# Patient Record
Sex: Male | Born: 1985 | Race: White | Hispanic: No | Marital: Married | Smoking: Current every day smoker
Health system: Southern US, Community
[De-identification: ages and names within clinical notes are randomized; demographics above are authoritative.]

## PROBLEM LIST (undated history)

## (undated) DIAGNOSIS — F32A Depression, unspecified: Secondary | ICD-10-CM

## (undated) DIAGNOSIS — L509 Urticaria, unspecified: Secondary | ICD-10-CM

## (undated) DIAGNOSIS — I10 Essential (primary) hypertension: Secondary | ICD-10-CM

## (undated) DIAGNOSIS — F4310 Post-traumatic stress disorder, unspecified: Secondary | ICD-10-CM

## (undated) DIAGNOSIS — T783XXA Angioneurotic edema, initial encounter: Secondary | ICD-10-CM

## (undated) DIAGNOSIS — F419 Anxiety disorder, unspecified: Secondary | ICD-10-CM

## (undated) DIAGNOSIS — F329 Major depressive disorder, single episode, unspecified: Secondary | ICD-10-CM

## (undated) HISTORY — DX: Angioneurotic edema, initial encounter: T78.3XXA

## (undated) HISTORY — DX: Urticaria, unspecified: L50.9

## (undated) HISTORY — PX: APPENDECTOMY: SHX54

---

## 2012-06-08 ENCOUNTER — Encounter (HOSPITAL_COMMUNITY): Payer: Self-pay

## 2012-06-08 ENCOUNTER — Emergency Department (HOSPITAL_COMMUNITY)
Admission: EM | Admit: 2012-06-08 | Discharge: 2012-06-08 | Disposition: A | Discharge status: Home / Self Care | Payer: Commercial Managed Care - PPO | Attending: Emergency Medicine | Admitting: Emergency Medicine

## 2012-06-08 DIAGNOSIS — F329 Major depressive disorder, single episode, unspecified: Secondary | ICD-10-CM | POA: Insufficient documentation

## 2012-06-08 DIAGNOSIS — F411 Generalized anxiety disorder: Secondary | ICD-10-CM | POA: Insufficient documentation

## 2012-06-08 DIAGNOSIS — Z8659 Personal history of other mental and behavioral disorders: Secondary | ICD-10-CM | POA: Insufficient documentation

## 2012-06-08 DIAGNOSIS — G479 Sleep disorder, unspecified: Secondary | ICD-10-CM | POA: Insufficient documentation

## 2012-06-08 DIAGNOSIS — F3289 Other specified depressive episodes: Secondary | ICD-10-CM | POA: Insufficient documentation

## 2012-06-08 DIAGNOSIS — F419 Anxiety disorder, unspecified: Secondary | ICD-10-CM

## 2012-06-08 DIAGNOSIS — R630 Anorexia: Secondary | ICD-10-CM | POA: Insufficient documentation

## 2012-06-08 DIAGNOSIS — F17200 Nicotine dependence, unspecified, uncomplicated: Secondary | ICD-10-CM | POA: Insufficient documentation

## 2012-06-08 NOTE — ED Notes (Signed)
He c/o clouded thinking; plus some G.I. Upset, including occasional n/v and diarrhea. He is more concerned with his inability to concentrate and finds himself at times staring at objects upon which he is supposed to be working for indeterminate amounts of time.  He states he is a 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 His wife confirms all these symptoms.

## 2012-06-08 NOTE — ED Notes (Signed)
At time of discharge patient was alert and oriented x 3 . Wife at patient sides not expressing in concerns.

## 2012-06-08 NOTE — ED Provider Notes (Signed)
History     CSN: [id number]  Arrival date & time 06/08/12  1653   First MD Initiated Contact with Patient 06/08/12 1855      Chief Complaint  Patient presents with  . Altered Mental Status    (Consider location/radiation/quality/duration/timing/severity/associated sxs/[place] treatment) HPI Comments: Patient with a history of PTSD who has served in the [employer] and was in [place] one year ago presents today with a chief complaint of anxiety, difficulty sleeping, difficulty focusing, and decreased energy.  He reports that while at work he feels that there are moments were he "spaces out" temporarily.  Symptoms have been present for the past 2 weeks.  Wife reports that she has not noticed any of these episodes at home, but has noticed that the patient has been more anxious.  Wife reports that she has not noticed any confusion.  Patient does admit to being more anxious and depressed recently, but denies SI or HI.  He reports that he has seen a Counselor in the past, but is currently not seeing anyone.  He states that he has been on medication for anxiety in the past, but is not on any medication currently.  He did not like the way the medication made him feel.  He denies fever, chills, headache, dizziness, syncope, nausea, or vomiting.    The history is provided by the patient and the spouse.    No past medical history on file.  Past Surgical History  Procedure Laterality Date  . Appendectomy      History reviewed. No pertinent family history.  History  Substance Use Topics  . Smoking status: Current Every Day Smoker -- 0.50 packs/day    Types: Cigarettes  . Smokeless tobacco: Not on file  . Alcohol Use: Not on file      Review of Systems  Constitutional: Positive for appetite change.  Neurological: Negative for dizziness, syncope, light-headedness and headaches.  Psychiatric/Behavioral: Positive for sleep disturbance, dysphoric mood and decreased concentration. Negative for  suicidal ideas, hallucinations and self-injury. The patient is nervous/anxious.   All other systems reviewed and are negative.    Allergies  Other  Home Medications  No current outpatient prescriptions on file.  BP 163/94  Pulse 113  Temp(Src) 98.8 F (37.1 C) (Oral)  Resp 22  SpO2 100%  Physical Exam  Nursing note and vitals reviewed. Constitutional: He is oriented to person, place, and time. He appears well-developed and well-nourished. No distress.  HENT:  Head: Normocephalic and atraumatic.  Mouth/Throat: Oropharynx is clear and moist.  Eyes: EOM are normal. Pupils are equal, round, and reactive to light.  Neck: Normal range of motion. Neck supple.  Cardiovascular: Normal rate, regular rhythm and normal heart sounds.   Pulmonary/Chest: Effort normal and breath sounds normal. No respiratory distress. He has no wheezes. He has no rales.  Musculoskeletal: Normal range of motion.  Neurological: He is alert and oriented to person, place, and time. He has normal strength. No cranial nerve deficit or sensory deficit. Coordination and gait normal.  Skin: Skin is warm and dry. He is not diaphoretic.  Psychiatric: His mood appears anxious. He expresses no homicidal and no suicidal ideation. He expresses no suicidal plans and no homicidal plans.    ED Course  Procedures (including critical care time)  Labs Reviewed - No data to display No results found.   No diagnosis found.  Patient discussed with Dr. Oletta Lamas.  MDM  Patient with a history of PTSD, depression, and anxiety presents today with difficulty  focusing, difficulty sleeping, and increased anxiety.  He denies SI or HI.  He is alert and orientated with a normal neurological exam in the ED.  Do not feel that further work up is indicated.  Patient given resource guide and instructed to follow up with a Therapist and Psychiatrist.          Pascal Lux [place], PA-C 06/10/12 1253

## 2012-06-10 NOTE — ED Provider Notes (Signed)
Medical screening examination/treatment/procedure(s) were performed by non-physician practitioner and as supervising physician I was immediately available for consultation/collaboration.   Gavin Pound. Oletta Lamas, MD 06/10/12 1610

## 2012-08-06 ENCOUNTER — Emergency Department (HOSPITAL_BASED_OUTPATIENT_CLINIC_OR_DEPARTMENT_OTHER)
Admission: EM | Admit: 2012-08-06 | Discharge: 2012-08-06 | Disposition: A | Discharge status: Home / Self Care | Payer: Self-pay | Attending: Emergency Medicine | Admitting: Emergency Medicine

## 2012-08-06 ENCOUNTER — Emergency Department (HOSPITAL_BASED_OUTPATIENT_CLINIC_OR_DEPARTMENT_OTHER): Payer: Self-pay

## 2012-08-06 ENCOUNTER — Encounter (HOSPITAL_BASED_OUTPATIENT_CLINIC_OR_DEPARTMENT_OTHER): Payer: Self-pay | Admitting: *Deleted

## 2012-08-06 DIAGNOSIS — Z8659 Personal history of other mental and behavioral disorders: Secondary | ICD-10-CM | POA: Insufficient documentation

## 2012-08-06 DIAGNOSIS — F329 Major depressive disorder, single episode, unspecified: Secondary | ICD-10-CM | POA: Insufficient documentation

## 2012-08-06 DIAGNOSIS — F3289 Other specified depressive episodes: Secondary | ICD-10-CM | POA: Insufficient documentation

## 2012-08-06 DIAGNOSIS — F411 Generalized anxiety disorder: Secondary | ICD-10-CM | POA: Insufficient documentation

## 2012-08-06 DIAGNOSIS — I10 Essential (primary) hypertension: Secondary | ICD-10-CM | POA: Insufficient documentation

## 2012-08-06 DIAGNOSIS — R55 Syncope and collapse: Secondary | ICD-10-CM | POA: Insufficient documentation

## 2012-08-06 DIAGNOSIS — F17200 Nicotine dependence, unspecified, uncomplicated: Secondary | ICD-10-CM | POA: Insufficient documentation

## 2012-08-06 HISTORY — DX: Post-traumatic stress disorder, unspecified: F43.10

## 2012-08-06 HISTORY — DX: Anxiety disorder, unspecified: F41.9

## 2012-08-06 HISTORY — DX: Depression, unspecified: F32.A

## 2012-08-06 HISTORY — DX: Essential (primary) hypertension: I10

## 2012-08-06 HISTORY — DX: Major depressive disorder, single episode, unspecified: F32.9

## 2012-08-06 LAB — URINALYSIS, ROUTINE W REFLEX MICROSCOPIC
Specific Gravity, Urine: 1.026 (ref 1.005–1.030)
pH: 6 (ref 5.0–8.0)

## 2012-08-06 LAB — CBC WITH DIFFERENTIAL/PLATELET
Basophils Relative: 0 % (ref 0–1)
Eosinophils Relative: 0 % (ref 0–5)
HCT: 44.7 % (ref 39.0–52.0)
Hemoglobin: 16.6 g/dL (ref 13.0–17.0)
Lymphocytes Relative: 5 % — ABNORMAL LOW (ref 12–46)
MCHC: 37.1 g/dL — ABNORMAL HIGH (ref 30.0–36.0)
Monocytes Relative: 10 % (ref 3–12)
Neutro Abs: 9.7 10*3/uL — ABNORMAL HIGH (ref 1.7–7.7)
Neutrophils Relative %: 85 % — ABNORMAL HIGH (ref 43–77)
RBC: 5.03 MIL/uL (ref 4.22–5.81)

## 2012-08-06 LAB — BASIC METABOLIC PANEL
BUN: 10 mg/dL (ref 6–23)
CO2: 24 mEq/L (ref 19–32)
Chloride: 98 mEq/L (ref 96–112)
Glucose, Bld: 107 mg/dL — ABNORMAL HIGH (ref 70–99)
Potassium: 3.7 mEq/L (ref 3.5–5.1)
Sodium: 136 mEq/L (ref 135–145)

## 2012-08-06 LAB — URINE MICROSCOPIC-ADD ON

## 2012-08-06 LAB — GLUCOSE, CAPILLARY: Glucose-Capillary: 114 mg/dL — ABNORMAL HIGH (ref 70–99)

## 2012-08-06 MED ORDER — SODIUM CHLORIDE 0.9 % IV BOLUS (SEPSIS)
1000.0000 mL | Freq: Once | INTRAVENOUS | Status: AC
  Administered 2012-08-06: 1000 mL via INTRAVENOUS

## 2012-08-06 NOTE — ED Notes (Signed)
Patient transported to X-ray 

## 2012-08-06 NOTE — ED Notes (Signed)
Patient ambulatory to restroom without difficulty or assistance.

## 2012-08-06 NOTE — ED Provider Notes (Signed)
Medical screening examination/treatment/procedure(s) were performed by non-physician practitioner and as supervising physician I was immediately available for consultation/collaboration.   Gwyneth Sprout, MD 08/06/12 [phone]

## 2012-08-06 NOTE — ED Notes (Signed)
Pt c/o feeling hot and tired and work, passing out. Pt sts he now feels "more normal but tired". Pt sts he has not been sleeping and has had 2 days of n/v/d. Pt recently started new psych med.

## 2012-08-06 NOTE — ED Provider Notes (Signed)
History    CSN: [id number] Arrival date & time 08/06/12  1302  First MD Initiated Contact with Patient 08/06/12 1330     Chief Complaint  Patient presents with  . Loss of Consciousness   (Consider location/radiation/quality/duration/timing/severity/associated sxs/[place] Treatment) HPI Comments: 27 y.o. 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 Pt reports that he has been fighting a "stomach bug" for the last two days including nausea, vomiting, diarrhea, and mild generalized stomach discomfort. Pt denies fever, dysuria, hematuria. Pt states that although he has been vomiting, he has been able to eat and drink and keep it down for a few hours before vomiting. Denies nausea/vomiting today. Pt was feeling better today so decided to go to work. States it was hot and he was feeling tired and weak from being sick and felt he was "going down," so started to lay himself on the floor.  Pt states he was groggy coming to, but felt better when his co-workers brought him into the air conditioned break room. Co-workers called EMS. Admits he has been having leg cramps and arm cramps for several days. Denies visual disturbances, headaches.   Patient is a 27 y.o. male presenting with syncope.  Loss of Consciousness Associated symptoms: no chest pain, no diaphoresis, no dizziness, no fever, no headaches, no nausea, no palpitations, no shortness of breath, no vomiting and no weakness    Past Medical History  Diagnosis Date  . PTSD (post-traumatic stress disorder)   . Hypertension   . Depression   . Anxiety    Past Surgical History  Procedure Laterality Date  . Appendectomy     History reviewed. No pertinent family history. History  Substance Use Topics  . Smoking status: Current Every Day Smoker -- 0.50 packs/day    Types: Cigarettes  . Smokeless tobacco: Not on file  . Alcohol Use: No    Review of Systems  Constitutional: Negative for  fever and diaphoresis.  HENT: Negative for neck pain and neck stiffness.   Eyes: Negative for visual disturbance.  Respiratory: Negative for apnea, chest tightness and shortness of breath.   Cardiovascular: Positive for syncope. Negative for chest pain and palpitations.  Gastrointestinal: Negative for nausea, vomiting, diarrhea and constipation.  Genitourinary: Negative for dysuria.  Musculoskeletal: Negative for gait problem.  Skin: Negative for rash.  Neurological: Positive for syncope. Negative for dizziness, weakness, light-headedness, numbness and headaches.    Allergies  Other  Home Medications   Current Outpatient Rx  Name  Route  Sig  Dispense  Refill  . TRAZODONE HCL PO   Oral   Take by mouth.         . Vortioxetine HBr (BRINTELLIX PO)   Oral   Take by mouth.          BP 159/95  Pulse 104  Temp(Src) 98.5 F (36.9 C) (Oral)  Resp 18  SpO2 100% Physical Exam  Nursing note and vitals reviewed. Constitutional: He is oriented to person, place, and time. He appears well-developed and well-nourished. No distress.  HENT:  Head: Normocephalic and atraumatic.  Eyes: Conjunctivae and EOM are normal.  Neck: Normal range of motion. Neck supple.  No meningeal signs  Cardiovascular: Normal rate, regular rhythm and normal heart sounds.  Exam reveals no gallop and no friction rub.   No murmur heard. Pulmonary/Chest: Effort normal and breath sounds normal. No respiratory distress. He has no wheezes. He has no rales. He exhibits  no tenderness.  Abdominal: Soft. Bowel sounds are normal. He exhibits no distension. There is no tenderness. There is no rebound and no guarding.  Musculoskeletal: Normal range of motion. He exhibits no edema and no tenderness.  FROM to upper and lower extremities  Neurological: He is alert and oriented to person, place, and time. No cranial nerve deficit.  Speech is clear and goal oriented, follows commands Sensation normal to light touch and two  point discrimination Moves extremities without ataxia, coordination intact Normal gait and balance Normal strength in upper and lower extremities bilaterally including dorsiflexion and plantar flexion, strong and equal grip strength   Skin: Skin is warm and dry. He is not diaphoretic. No erythema.  Psychiatric: He has a normal mood and affect.    ED Course  Procedures (including critical care time) Labs Reviewed  GLUCOSE, CAPILLARY - Abnormal; Notable for the following:    Glucose-Capillary 114 (*)    All other components within normal limits  CBC WITH DIFFERENTIAL - Abnormal; Notable for the following:    WBC 11.4 (*)    MCHC 37.1 (*)    Platelets 123 (*)    Neutrophils Relative % 85 (*)    Lymphocytes Relative 5 (*)    Neutro Abs 9.7 (*)    Lymphs Abs 0.6 (*)    Monocytes Absolute 1.1 (*)    All other components within normal limits  BASIC METABOLIC PANEL - Abnormal; Notable for the following:    Glucose, Bld 107 (*)    GFR calc non Af Amer 81 (*)    All other components within normal limits  URINALYSIS, ROUTINE W REFLEX MICROSCOPIC - Abnormal; Notable for the following:    Color, Urine AMBER (*)    APPearance CLOUDY (*)    Hgb urine dipstick TRACE (*)    Bilirubin Urine SMALL (*)    Ketones, ur 15 (*)    Protein, ur 100 (*)    All other components within normal limits  URINE MICROSCOPIC-ADD ON - Abnormal; Notable for the following:    Squamous Epithelial / LPF FEW (*)    Casts HYALINE CASTS (*)    All other components within normal limits   Dg Chest 2 View  08/06/2012   *RADIOLOGY REPORT*  Clinical Data: Syncope  CHEST - 2 VIEW  Comparison: None.  Findings: Cardiomediastinal silhouette is unremarkable.  No acute infiltrate or pleural effusion.  No pulmonary edema.  Bony thorax is unremarkable.  IMPRESSION: No active disease.   Original Report Authenticated By: Natasha Mead, M.D.   1. Syncope and collapse     MDM  Pt afebrile. EKG shows sinus rhythm. Vasovagal is high  on the differential, but want to rule out cardiac, infection, electrolyte, hypoglycemia. Will order basic labs, IVF, CXR, and re-evaluate.  EKG and chest xray are normal. Lab work unconcerning. Pt states feeling better after 2L of fluid. Successful PO challenge. Ambulates well around ED.  Pt tolerated orthostatics well and ambulated well. At this time there does not appear to be any evidence of an acute emergency medical condition and the patient appears stable for discharge with appropriate outpatient follow up.Diagnosis was discussed with patient who verbalizes understanding and is agreeable to discharge. Pt case discussed with Dr. Anitra Lauth who agrees with plan.      Glade Nurse, PA-C 08/06/12 1659

## 2012-08-28 NOTE — ED Notes (Signed)
Pt wife presented to ED lobby requesting a work note for pt from visit on 07/15. Pt family member told that pt must present to ED with photo ID in order for a reprint to be possible.

## 2012-08-29 NOTE — ED Notes (Signed)
Pt presented to ED lobby with photo ID requesting copy of work note given on 07/15 visit. Note provided.

## 2014-07-30 IMAGING — CR DG CHEST 2V
2 series · 2 of 2 positions shown · non-contrast
Comparison: None.

CLINICAL DATA: Syncope

CHEST - 2 VIEW

[w chest pa]
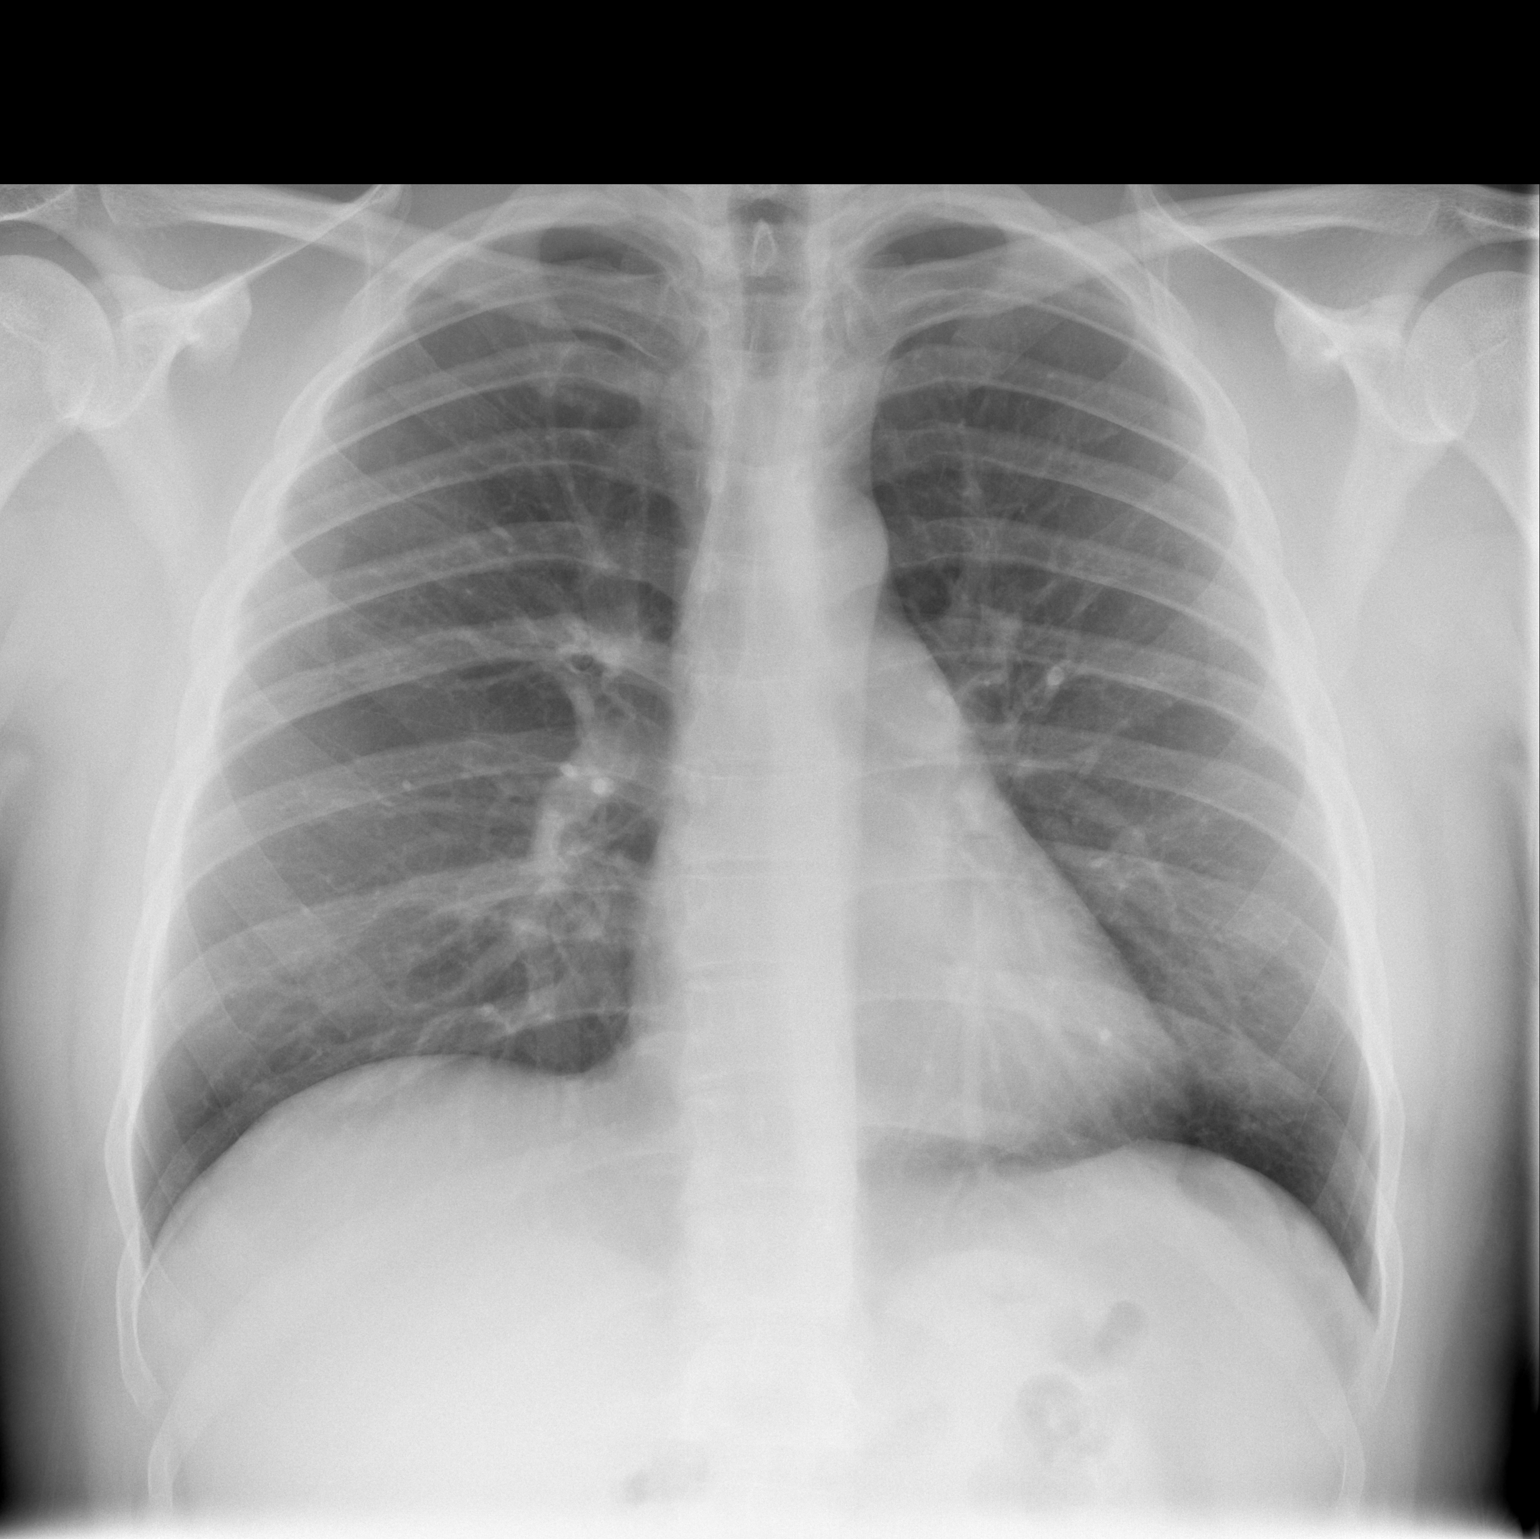

[w chest lat]
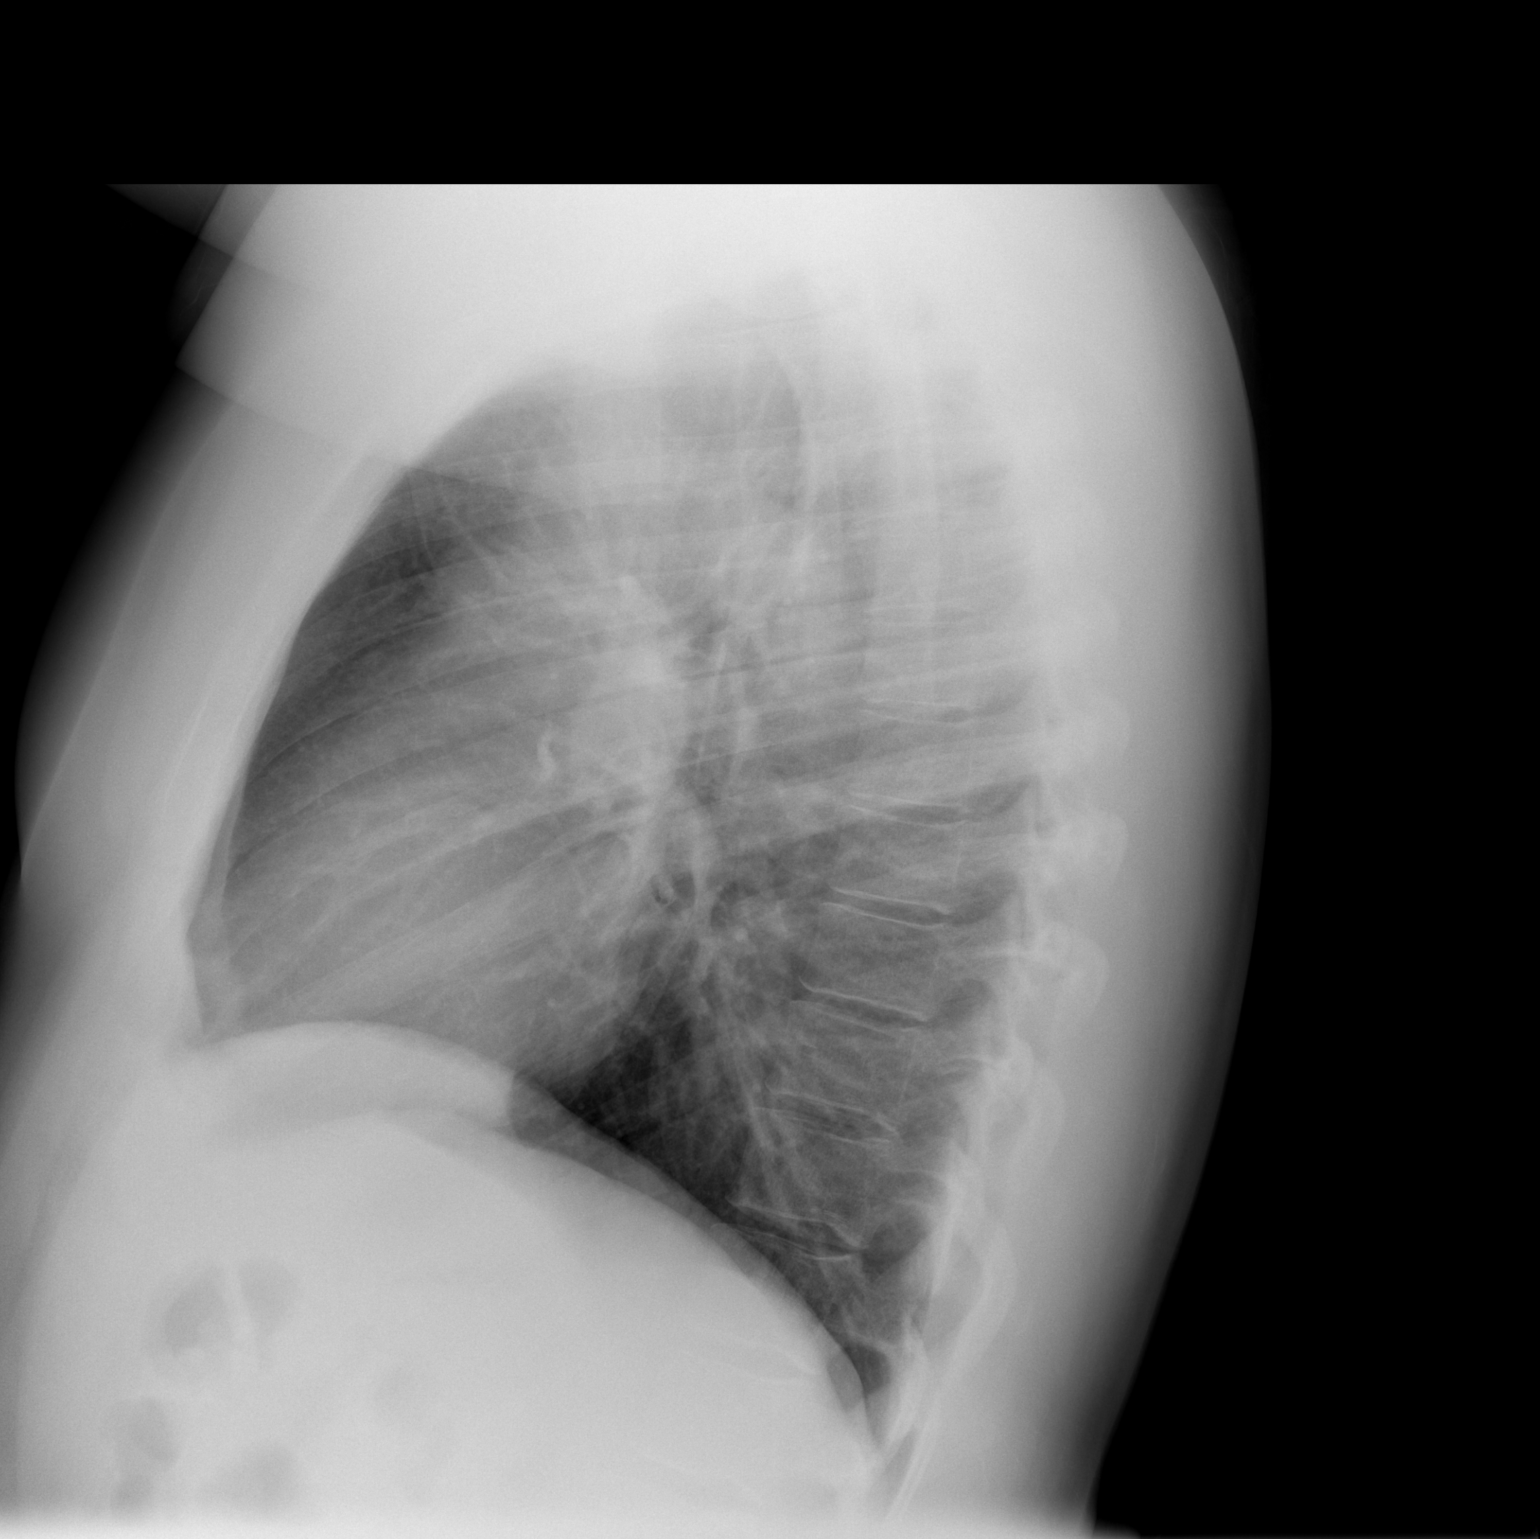

[2 of 2 positions shown; findings below may reference images not displayed]

FINDINGS: Cardiomediastinal silhouette is unremarkable.  No acute
infiltrate or pleural effusion.  No pulmonary edema.  Bony thorax
is unremarkable.
IMPRESSION: No active disease.

## 2016-02-09 ENCOUNTER — Ambulatory Visit: Payer: Self-pay

## 2016-02-16 ENCOUNTER — Telehealth: Payer: Self-pay

## 2016-02-16 NOTE — Telephone Encounter (Signed)
lvm to call back to reschedule appt from 02/09/16

## 2019-12-25 ENCOUNTER — Encounter: Payer: Self-pay | Admitting: Allergy

## 2019-12-25 ENCOUNTER — Ambulatory Visit (INDEPENDENT_AMBULATORY_CARE_PROVIDER_SITE_OTHER): Payer: No Typology Code available for payment source | Admitting: Allergy

## 2019-12-25 ENCOUNTER — Other Ambulatory Visit: Payer: Self-pay

## 2019-12-25 VITALS — BP 120/78 | HR 74 | Temp 98.7°F | Resp 18 | Ht 72.5 in | Wt 190.2 lb

## 2019-12-25 DIAGNOSIS — L508 Other urticaria: Secondary | ICD-10-CM | POA: Diagnosis not present

## 2019-12-25 DIAGNOSIS — T783XXD Angioneurotic edema, subsequent encounter: Secondary | ICD-10-CM

## 2019-12-25 MED ORDER — FAMOTIDINE 20 MG PO TABS: 20.0000 mg | ORAL_TABLET | Freq: Two times a day (BID) | ORAL | 5 refills | Status: DC

## 2019-12-25 MED ORDER — CETIRIZINE HCL 10 MG PO TABS: 10.0000 mg | ORAL_TABLET | Freq: Two times a day (BID) | ORAL | 5 refills | Status: DC

## 2019-12-25 NOTE — Progress Notes (Signed)
New Patient Note  RE: Leon Reyes MRN: [id number] DOB: Jun 27, 1985 Date of Office Visit: 12/25/2019  Referring provider: Lytle Michaels, DO Primary care provider: Vick Frees, DO  Chief Complaint: rash and itching  History of present illness: Leon Reyes is a 34 y.o. male presenting today for consultation for pruritus, rash.   He reports he keeps breaking out in a rash every few weeks.  He states he has no explanations/triggers for it.  His diet is very consistent except for holidays.  He states his schedule is also very consistent day to day. The rash started in Oct 2021 and denies having hives [place] to this.  He was seen in the [facility] [place] for hives and got a steroid injection which he states he had worsening rash the next day.  He went back to this [place] and he [facility] where he received more steroid injections and oral steroid taper.   He has been taking loratadine in PM and cetirizine in AM and states by end of the day the hives are gone.  He has had swelling of his hands, feet and around his hairline.  The rash is very itchy.  He denies any marks or bruising left behind once resolved.  No fevers, joint aches or pains with the hives.  He denies any exacerbating factors.  He does exercise on a routine basis and has not noted any issues with his hives worsening.  He also has not noted any pressure induced hives.  He also has not noted any temperature induced hives. He provided pictures today that are consistent with urticarial wheals or hives.  Review of systems: Review of Systems  Constitutional: Negative.   HENT: Negative.   Eyes: Negative.   Respiratory: Negative.   Cardiovascular: Negative.   Gastrointestinal: Negative.   Musculoskeletal: Negative.   Skin: Positive for itching and rash.  Neurological: Negative.     All other systems negative unless noted above in HPI  Past medical history: Past  Medical History:  Diagnosis Date  . Angio-edema   . Anxiety   . Depression   . Hypertension   . PTSD (post-traumatic stress disorder)   . Urticaria     Past surgical history: Past Surgical History:  Procedure Laterality Date  . APPENDECTOMY      Family history:  Family History  Problem Relation Age of Onset  . Psoriasis Mother     Social history: He lives in a home without carpeting with electric heating and central cooling.  Dogs in the home.  There is no concern for water damage, mildew or roaches in the home.  He is the Customer service manager.  He does smoke cigarettes half pack per day.  Medication List: Current Outpatient Medications  Medication Sig Dispense Refill  . TRAZODONE HCL PO Take by mouth.     No current facility-administered medications for this visit.    Known medication allergies: Allergies  Allergen Reactions  . Other Nausea And Vomiting    "Mycins"     Physical examination: Blood pressure 120/78, pulse 74, temperature 98.7 F (37.1 C), temperature source Temporal, resp. rate 18, height 6' 0.5" (1.842 m), weight 190 lb 3.2 oz (86.3 kg), SpO2 97 %.  General: Alert, interactive, in no acute distress. HEENT: PERRLA, TMs pearly gray, turbinates non-edematous without discharge, post-pharynx non erythematous. Neck: Supple without lymphadenopathy. Lungs: Clear to auscultation without wheezing, rhonchi or rales. {no increased work of breathing. CV:  Normal S1, S2 without murmurs. Abdomen: Nondistended, nontender. Skin: Warm and dry, without lesions or rashes. Extremities:  No clubbing, cyanosis or edema. Neuro:   Grossly intact.  Diagnositics/Labs: None today.  Recent urticaria outbreak  Assessment and plan: Urticaria with angioedema, chronic  - at this time etiology of hives and swelling is unknown.  Hives can be caused by a variety of different triggers including illness/infection, foods, medications, stings, exercise, pressure, vibrations,  extremes of temperature to name a few however majority of the time there is no identifiable trigger.  Your symptoms have been ongoing for >6 weeks making this chronic thus will obtain labwork to evaluate: CBC w diff, CMP, tryptase, hive panel, environmental panel, alpha-gal panel  - would continue antihistamine regimen and recommend the following: Cetirizine 1 tab twice a day with Famotidine 1 tab twice a day.   Famotidine is a Histamine-2 blocker while Cetirizine is a Histamine-1 blocker  - if you are hive/swelling free for at least 3-4 weeks then can try to wean off medications as follows:    Cetirizine (Zyrtec) 10mg  twice a day and famotidine 20 mg (Pepcid) once a day.  If no symptoms for 7-14 days then decrease to.  Cetirizine (Zyrtec) 10mg  twice a day.  If no symptoms for 7-14 days then decrease to.  Cetirizine (Zyrtec) 10mg  once a day.  May use Benadryl (diphenhydramine) as needed for breakthrough hives        If symptoms return, then step up dosage  - if the above antihistamine regimen does not help improve hive control then will add in Singulair and if still not enough then consider Xolair monthly injections.  This will be discussed if further detail in needing to initiate use  Follow-up in 2-3 months or sooner if needed   I appreciate the opportunity to take part in Hillery's care. Please do not hesitate to contact me with questions.  Sincerely,   , MD Allergy/Immunology [facility]

## 2019-12-25 NOTE — Patient Instructions (Signed)
Hives with swelling, chronic  - at this time etiology of hives and swelling is unknown.  Hives can be caused by a variety of different triggers including illness/infection, foods, medications, stings, exercise, pressure, vibrations, extremes of temperature to name a few however majority of the time there is no identifiable trigger.  Your symptoms have been ongoing for >6 weeks making this chronic thus will obtain labwork to evaluate: CBC w diff, CMP, tryptase, hive panel, environmental panel, alpha-gal panel  - would continue antihistamine regimen and recommend the following: Cetirizine 1 tab twice a day with Famotidine 1 tab twice a day.   Famotidine is a Histamine-2 blocker while Cetirizine is a Histamine-1 blocker  - if you are hive/swelling free for at least 3-4 weeks then can try to wean off medications as follows:    Cetirizine (Zyrtec) 10mg  twice a day and famotidine 20 mg (Pepcid) once a day.  If no symptoms for 7-14 days then decrease to  Cetirizine (Zyrtec) 10mg  twice a day.  If no symptoms for 7-14 days then decrease to  Cetirizine (Zyrtec) 10mg  once a day.  May use Benadryl (diphenhydramine) as needed for breakthrough hives        If symptoms return, then step up dosage  - if the above antihistamine regimen does not help improve hive control then will add in Singulair and if still not enough then consider Xolair monthly injections.  This will be discussed if further detail in needing to initiate use  Follow-up in 2-3 months or sooner if needed  **We are ordering labs, so please allow 1-2 weeks for the results to come back.  With the newly implemented Cures Act, the labs might be visible to you at the same time that they become visible to me.  However, I will not address the results until all of the results come  back, so please be patient.  In the meantime, continue avoiding your triggering food(s) in your After Visit Summary, including avoidance measures (if applicable), until you  hear from me about the results.

## 2019-12-27 LAB — ALPHA-GAL PANEL

## 2019-12-30 LAB — ALPHA-GAL PANEL

## 2020-01-09 LAB — COMPREHENSIVE METABOLIC PANEL
ALT: 27 IU/L (ref 0–44)
AST: 43 IU/L — ABNORMAL HIGH (ref 0–40)
Albumin/Globulin Ratio: 3.1 — ABNORMAL HIGH (ref 1.2–2.2)
Albumin: 5 g/dL (ref 4.0–5.0)
Alkaline Phosphatase: 42 IU/L — ABNORMAL LOW (ref 44–121)
BUN/Creatinine Ratio: 18 (ref 9–20)
BUN: 21 mg/dL — ABNORMAL HIGH (ref 6–20)
Bilirubin Total: 0.3 mg/dL (ref 0.0–1.2)
CO2: 23 mmol/L (ref 20–29)
Calcium: 9.7 mg/dL (ref 8.7–10.2)
Chloride: 100 mmol/L (ref 96–106)
Creatinine, Ser: 1.17 mg/dL (ref 0.76–1.27)
GFR calc Af Amer: 93 mL/min/{1.73_m2} (ref >59)
GFR calc non Af Amer: 80 mL/min/{1.73_m2} (ref >59)
Globulin, Total: 1.6 g/dL (ref 1.5–4.5)
Glucose: 84 mg/dL (ref 65–99)
Potassium: 4.2 mmol/L (ref 3.5–5.2)
Sodium: 137 mmol/L (ref 134–144)
Total Protein: 6.6 g/dL (ref 6.0–8.5)

## 2020-01-09 LAB — CBC WITH DIFFERENTIAL
Basophils Absolute: 0 10*3/uL (ref 0.0–0.2)
Basos: 1 %
EOS (ABSOLUTE): 0.1 10*3/uL (ref 0.0–0.4)
Eos: 2 %
Hematocrit: 44.9 % (ref 37.5–51.0)
Hemoglobin: 15.6 g/dL (ref 13.0–17.7)
Immature Grans (Abs): 0 10*3/uL (ref 0.0–0.1)
Immature Granulocytes: 0 %
Lymphocytes Absolute: 1.9 10*3/uL (ref 0.7–3.1)
Lymphs: 25 %
MCH: 31.8 pg (ref 26.6–33.0)
MCHC: 34.7 g/dL (ref 31.5–35.7)
MCV: 91 fL (ref 79–97)
Monocytes Absolute: 0.7 10*3/uL (ref 0.1–0.9)
Monocytes: 9 %
Neutrophils Absolute: 4.8 10*3/uL (ref 1.4–7.0)
Neutrophils: 63 %
RBC: 4.91 x10E6/uL (ref 4.14–5.80)
RDW: 11.8 % (ref 11.6–15.4)
WBC: 7.5 10*3/uL (ref 3.4–10.8)

## 2020-01-09 LAB — ALLERGENS W/TOTAL IGE AREA 2
Alternaria Alternata IgE: <0.1 kU/L
Aspergillus Fumigatus IgE: <0.1 kU/L
Bermuda Grass IgE: <0.1 kU/L
Cat Dander IgE: <0.1 kU/L
Cedar, Mountain IgE: <0.1 kU/L
Cladosporium Herbarum IgE: <0.1 kU/L
Cockroach, German IgE: <0.1 kU/L
Common Silver Birch IgE: <0.1 kU/L
Cottonwood IgE: <0.1 kU/L
D Farinae IgE: 0.29 kU/L — AB
D Pteronyssinus IgE: 0.21 kU/L — AB
Dog Dander IgE: <0.1 kU/L
Elm, American IgE: <0.1 kU/L
IgE (Immunoglobulin E), Serum: 18 IU/mL (ref 6–495)
Johnson Grass IgE: <0.1 kU/L
Maple/Box Elder IgE: <0.1 kU/L
Mouse Urine IgE: <0.1 kU/L
Oak, White IgE: <0.1 kU/L
Pecan, Hickory IgE: <0.1 kU/L
Penicillium Chrysogen IgE: <0.1 kU/L
Pigweed, Rough IgE: <0.1 kU/L
Ragweed, Short IgE: <0.1 kU/L
Sheep Sorrel IgE Qn: <0.1 kU/L
Timothy Grass IgE: <0.1 kU/L
White Mulberry IgE: <0.1 kU/L

## 2020-01-09 LAB — ALPHA-GAL PANEL
Alpha Gal IgE*: <0.1 kU/L (ref <0.10)
Beef (Bos spp) IgE: <0.1 kU/L (ref <0.35)
Class Interpretation: 0
Class Interpretation: 0
Class Interpretation: 0
Lamb/Mutton (Ovis spp) IgE: <0.1 kU/L (ref <0.35)
Pork (Sus spp) IgE: <0.1 kU/L (ref <0.35)

## 2020-01-09 LAB — TSH+FREE T4
Free T4: 1.37 ng/dL (ref 0.82–1.77)
TSH: 1.19 u[IU]/mL (ref 0.450–4.500)

## 2020-01-09 LAB — THYROID ANTIBODIES
Thyroglobulin Antibody: <1 IU/mL (ref 0.0–0.9)
Thyroperoxidase Ab SerPl-aCnc: <8 IU/mL (ref 0–34)

## 2020-01-09 LAB — CHRONIC URTICARIA: cu index: 12.8 — ABNORMAL HIGH (ref <10)

## 2020-05-25 ENCOUNTER — Telehealth: Payer: Self-pay

## 2020-05-25 MED ORDER — CETIRIZINE HCL 10 MG PO TABS: 10.0000 mg | ORAL_TABLET | Freq: Two times a day (BID) | ORAL | 0 refills | Status: DC

## 2020-05-25 MED ORDER — FAMOTIDINE 20 MG PO TABS: 20.0000 mg | ORAL_TABLET | Freq: Two times a day (BID) | ORAL | 0 refills | Status: DC

## 2020-05-25 NOTE — Telephone Encounter (Signed)
Patient called and scheduled an appointment and asked for a courtesy refill on his Pepcid as well as his Zyrtec. Both of the refills have been sent to his pharmacy and patient is scheduled to come in office in June

## 2020-07-15 ENCOUNTER — Other Ambulatory Visit: Payer: Self-pay

## 2020-07-15 ENCOUNTER — Encounter: Payer: Self-pay | Admitting: Allergy

## 2020-07-15 ENCOUNTER — Ambulatory Visit (INDEPENDENT_AMBULATORY_CARE_PROVIDER_SITE_OTHER): Payer: No Typology Code available for payment source | Admitting: Family Medicine

## 2020-07-15 VITALS — BP 140/82 | HR 72 | Temp 98.3°F | Resp 18 | Ht 72.0 in | Wt 195.4 lb

## 2020-07-15 DIAGNOSIS — L508 Other urticaria: Secondary | ICD-10-CM

## 2020-07-15 DIAGNOSIS — J309 Allergic rhinitis, unspecified: Secondary | ICD-10-CM | POA: Insufficient documentation

## 2020-07-15 DIAGNOSIS — J3089 Other allergic rhinitis: Secondary | ICD-10-CM | POA: Diagnosis not present

## 2020-07-15 MED ORDER — FAMOTIDINE 20 MG PO TABS: 20.0000 mg | ORAL_TABLET | Freq: Two times a day (BID) | ORAL | 5 refills | Status: DC

## 2020-07-15 MED ORDER — CETIRIZINE HCL 10 MG PO TABS: 10.0000 mg | ORAL_TABLET | Freq: Two times a day (BID) | ORAL | 5 refills | Status: DC

## 2020-07-15 NOTE — Progress Notes (Signed)
[address] Debbora Presto [place] [address] Dept: [phone]  FOLLOW UP NOTE  Patient ID: Leon Reyes, male    DOB: November 27, 1985  Age: 35 y.o. MRN: [id number] Date of Office Visit: 07/15/2020  Assessment  Chief Complaint: Urticaria  HPI Gemayel Igarashi is a 35 year old male who presents to the clinic for follow-up visit.  He was last seen in this clinic on 12/25/2019 by Dr. Delorse Lek for evaluation of urticaria.  At that visit he had lab work and indicated sensitivity to dust mite as well as autoimmune urticaria.  At today's visit, he reports that his hives have been moderately well controlled with breakouts occurring about once a month.  He continues cetirizine 10 mg twice a day.  Allergic rhinitis is reported as well controlled with cetirizine only.  Previous lab work was reviewed in detail at today's visit.  His current medications are listed in the chart.   Drug Allergies:  Allergies  Allergen Reactions   Other Nausea And Vomiting    "Mycins"    Physical Exam: BP 140/82 (BP Location: Left Arm, Patient Position: Sitting, Cuff Size: Normal)   Pulse 72   Temp 98.3 F (36.8 C) (Temporal)   Resp 18   Ht 6' (1.829 m)   Wt 195 lb 6.4 oz (88.6 kg)   SpO2 97%   BMI 26.50 kg/m    Physical Exam Vitals reviewed.  Constitutional:      Appearance: Normal appearance.  HENT:     Head: Normocephalic and atraumatic.     Right Ear: Tympanic membrane normal.     Left Ear: Tympanic membrane normal.     Nose:     Comments: Bilateral nares normal.  Pharynx normal.  Ears normal.  Eyes normal.    Mouth/Throat:     Pharynx: Oropharynx is clear.  Eyes:     Conjunctiva/sclera: Conjunctivae normal.  Cardiovascular:     Rate and Rhythm: Normal rate and regular rhythm.     Heart sounds: Normal heart sounds. No murmur heard. Pulmonary:     Effort: Pulmonary effort is normal.     Breath sounds: Normal breath sounds.     Comments: Lungs clear to auscultation Musculoskeletal:         General: Normal range of motion.     Cervical back: Normal range of motion.  Skin:    General: Skin is warm and dry.  Neurological:     Mental Status: He is alert and oriented to person, place, and time.  Psychiatric:        Mood and Affect: Mood normal.        Behavior: Behavior normal.        Thought Content: Thought content normal.        Judgment: Judgment normal.   Assessment and Plan: 1. Chronic urticaria   2. Non-seasonal allergic rhinitis due to other allergic trigger     Meds ordered this encounter  Medications   cetirizine (ZYRTEC) 10 MG tablet    Sig: Take 1 tablet (10 mg total) by mouth 2 (two) times daily.    Dispense:  60 tablet    Refill:  5   famotidine (PEPCID) 20 MG tablet    Sig: Take 1 tablet (20 mg total) by mouth 2 (two) times daily.    Dispense:  60 tablet    Refill:  5    Patient Instructions  Hives (urticaria)  Cetirizine (Zyrtec) 10mg  twice a day and famotidine (Pepcid) 20 mg twice a day. If no symptoms for  7-14 days then decrease to. Cetirizine (Zyrtec) 10mg  twice a day and famotidine (Pepcid) 20 mg once a day.  If no symptoms for 7-14 days then decrease to. Cetirizine (Zyrtec) 10mg  twice a day.  If no symptoms for 7-14 days then decrease to. Cetirizine (Zyrtec) 10mg  once a day.  May use Benadryl (diphenhydramine) as needed for breakthrough hives       If symptoms return, then step up dosage  Keep a detailed symptom journal including foods eaten, contact with allergens, medications taken, weather changes.  If your symptoms are not well controlled with the medications listed above, consider Xolair injections. Written information provided  Allergic rhinitis Continue avoidance measures directed toward dust mites as listed below Continue cetirizine once a day as needed for a runny nose Consider saline nasal rinses as needed for nasal symptoms. Use this before any medicated nasal sprays for best result  Call the clinic if this treatment plan is  not working well for you  Follow up in 1 year or sooner if needed.   Return in about 1 year (around 07/15/2021), or if symptoms worsen or fail to improve.    Thank you for the opportunity to care for this patient.  Please do not hesitate to contact me with questions.  , FNP [facility]

## 2020-07-15 NOTE — Patient Instructions (Addendum)
Hives (urticaria)  Cetirizine (Zyrtec) 10mg  twice a day and famotidine (Pepcid) 20 mg twice a day. If no symptoms for 7-14 days then decrease to. Cetirizine (Zyrtec) 10mg  twice a day and famotidine (Pepcid) 20 mg once a day.  If no symptoms for 7-14 days then decrease to. Cetirizine (Zyrtec) 10mg  twice a day.  If no symptoms for 7-14 days then decrease to. Cetirizine (Zyrtec) 10mg  once a day.  May use Benadryl (diphenhydramine) as needed for breakthrough hives       If symptoms return, then step up dosage  Keep a detailed symptom journal including foods eaten, contact with allergens, medications taken, weather changes.  If your symptoms are not well controlled with the medications listed above, consider Xolair injections. Written information provided  Allergic rhinitis Continue avoidance measures directed toward dust mites as listed below Continue cetirizine once a day as needed for a runny nose Consider saline nasal rinses as needed for nasal symptoms. Use this before any medicated nasal sprays for best result  Call the clinic if this treatment plan is not working well for you  Follow up in 1 year or sooner if needed.

## 2020-12-14 ENCOUNTER — Telehealth: Payer: Self-pay | Admitting: Allergy

## 2020-12-14 NOTE — Telephone Encounter (Signed)
Faxed renewal of authorization request to [facility] [phone] as well as emailed it to Nurse Bridget ([email or web address]) and Nurse Gold ([email or web address].)   

## 2021-01-05 NOTE — Telephone Encounter (Signed)
Called [facility], [phone] ext [phone] to check on authorization request. Was told by representative they did not have a request on file.  ° °I asked representative for nurse on call for Allergy & Immunology. Representative directed me to call Nurse Gold. I also asked for the nurse supervisor, representative gave me the extension for Supervisor David, [phone].  ° °I sent an email to Nurse Gold & Nurse Bridget about about this as I have attempted to call the nurse multiple times with no response.  ° °Will reach out to Supervisor David if I get no response.  ° °

## 2021-01-07 NOTE — Telephone Encounter (Signed)
I have re-faxed the authorization renewal request to [phone]. °

## 2021-01-19 NOTE — Telephone Encounter (Signed)
Patient called back. I did advise patient of situation. Patient stated he was not surprised and would reach out to his PCP at the [facility].  Per patient's request I did email original authorization request to him so that he may provide it to his PCP.

## 2021-01-19 NOTE — Telephone Encounter (Signed)
Reached out to [facility] to check on authorization renewal request. Representative let me know that a renewal request had not been received. I asked to speak to a supervisor as I have attempted to reach out to nurses in charge of Allergy & Immunology without response. I was directed to Supervisor Mardella Layman. Left message for Onalee Hua to call me back about this matter.    I have emailed Nurse Doylene Canning, Nurse Clarisse Gouge & Nurse Supervisor Mardella Layman as well about this and requested someone call me back.      I called patient and lvm for him to call me back so that I am able to advise him of situation.

## 2021-02-02 NOTE — Telephone Encounter (Signed)
Reached out to [facility], spoke to [place]. Morrell Riddle let me know that she did not see a renewal of authorization request.   I have reached out to Nurse Clarisse Gouge and Nurse manager Onalee Hua & have not received any response.   Unfortunately I have done all I can in regards to patient's renewal request. It is not in the hands of the pt and the [place].

## 2021-06-06 ENCOUNTER — Telehealth: Payer: Self-pay | Admitting: Allergy

## 2021-06-06 MED ORDER — FAMOTIDINE 20 MG PO TABS: 20.0000 mg | ORAL_TABLET | Freq: Two times a day (BID) | ORAL | 0 refills | Status: AC

## 2021-06-06 MED ORDER — CETIRIZINE HCL 10 MG PO TABS: 10.0000 mg | ORAL_TABLET | Freq: Two times a day (BID) | ORAL | 0 refills | Status: DC

## 2021-06-06 NOTE — Telephone Encounter (Signed)
Leon Reyes called in and stated he needs refills on Famotidine and Cetirizine.  He would like those called in to the [facility] ?

## 2021-06-06 NOTE — Telephone Encounter (Signed)
Refills have been sent to the requested pharmacy however patient will need an OV for further refills. Patient has been informed however he is wondering if the visit will be cover under his wellcare. Albin Felling please advise? ?

## 2021-07-13 ENCOUNTER — Telehealth: Payer: Self-pay | Admitting: Allergy

## 2021-07-13 NOTE — Telephone Encounter (Signed)
Lm for pt to call us back about this we need to know which medications he is referring too

## 2021-07-13 NOTE — Telephone Encounter (Signed)
Patient said that he has to buy his rx his self because the [facility] has not been paid for them. He wanted to know if we were working on it to get [facility] to pay for them.

## 2021-07-15 ENCOUNTER — Ambulatory Visit: Payer: Commercial Managed Care - PPO | Admitting: Allergy

## 2021-08-01 MED ORDER — CETIRIZINE HCL 10 MG PO TABS: 10.0000 mg | ORAL_TABLET | Freq: Two times a day (BID) | ORAL | 0 refills | Status: DC

## 2021-08-01 NOTE — Telephone Encounter (Signed)
Patient called in - DOB/Pharmacy verified - advised need to schedule yearly follow up appt for future medication refills.  Patient stated he really doesn't take Famotidine only needs Cetirizine refilled.   Yearly follow up office visit schedule for 08/12/21 @ 10:45 am with Dr. Odella Aquas in [facility] office.   Patient verbalized understanding, no further questions.  Electronically sent in Cetirizine (Zyrtec) to [facility].

## 2021-08-11 DIAGNOSIS — F17210 Nicotine dependence, cigarettes, uncomplicated: Secondary | ICD-10-CM | POA: Insufficient documentation

## 2021-08-11 DIAGNOSIS — F1910 Other psychoactive substance abuse, uncomplicated: Secondary | ICD-10-CM | POA: Insufficient documentation

## 2021-08-11 DIAGNOSIS — I10 Essential (primary) hypertension: Secondary | ICD-10-CM | POA: Insufficient documentation

## 2021-08-11 DIAGNOSIS — G4700 Insomnia, unspecified: Secondary | ICD-10-CM | POA: Insufficient documentation

## 2021-08-11 DIAGNOSIS — F1021 Alcohol dependence, in remission: Secondary | ICD-10-CM | POA: Insufficient documentation

## 2021-08-11 DIAGNOSIS — G40209 Localization-related (focal) (partial) symptomatic epilepsy and epileptic syndromes with complex partial seizures, not intractable, without status epilepticus: Secondary | ICD-10-CM | POA: Insufficient documentation

## 2021-08-11 DIAGNOSIS — K219 Gastro-esophageal reflux disease without esophagitis: Secondary | ICD-10-CM | POA: Insufficient documentation

## 2021-08-11 DIAGNOSIS — M722 Plantar fascial fibromatosis: Secondary | ICD-10-CM | POA: Insufficient documentation

## 2021-08-11 DIAGNOSIS — F17200 Nicotine dependence, unspecified, uncomplicated: Secondary | ICD-10-CM | POA: Insufficient documentation

## 2021-08-11 DIAGNOSIS — F1220 Cannabis dependence, uncomplicated: Secondary | ICD-10-CM | POA: Insufficient documentation

## 2021-08-11 DIAGNOSIS — F4310 Post-traumatic stress disorder, unspecified: Secondary | ICD-10-CM | POA: Insufficient documentation

## 2021-08-12 ENCOUNTER — Ambulatory Visit (INDEPENDENT_AMBULATORY_CARE_PROVIDER_SITE_OTHER): Payer: Commercial Managed Care - PPO | Admitting: Internal Medicine

## 2021-08-12 ENCOUNTER — Encounter: Payer: Self-pay | Admitting: Internal Medicine

## 2021-08-12 VITALS — BP 132/70 | HR 87 | Temp 98.2°F | Resp 18 | Ht 72.0 in | Wt 194.2 lb

## 2021-08-12 DIAGNOSIS — J3089 Other allergic rhinitis: Secondary | ICD-10-CM | POA: Diagnosis not present

## 2021-08-12 DIAGNOSIS — L501 Idiopathic urticaria: Secondary | ICD-10-CM | POA: Diagnosis not present

## 2021-08-12 MED ORDER — CETIRIZINE HCL 10 MG PO TABS: 10.0000 mg | ORAL_TABLET | Freq: Every day | ORAL | 11 refills | Status: AC

## 2021-08-12 MED ORDER — FLUTICASONE PROPIONATE 50 MCG/ACT NA SUSP: 1.0000 | Freq: Every day | NASAL | 11 refills | Status: AC

## 2021-08-12 NOTE — Patient Instructions (Addendum)
Hives (urticaria): well controlled.  Until itch is fully resolved, I would stay on antihistamines.   Cetirizine (Zyrtec) 10mg  one a day You can go up to twice a day as needed to control the itch    Allergic rhinitis Continue avoidance measures directed toward dust mites as listed below Continue cetirizine once a day as needed for a runny nose Start Flonase 1 spray per nostril twice a day for stuffy nose  Consider saline nasal rinses as needed for nasal symptoms. Use this before any medicated nasal sprays for best result  Call the clinic if this treatment plan is not working well for you  Follow up in 1 year or sooner if needed.    Thank you so much for letting me partake in your care today.  Don't hesitate to reach out if you have any additional concerns!  , MD  [facility] [place] [place]

## 2021-08-12 NOTE — Progress Notes (Signed)
Follow Up Note  RE: [name] MRN: [id number] DOB: 02-Nov-1985 Date of Office Visit: 08/12/2021  Referring provider: Vick Frees* Primary care provider: Vick Frees, DO  Chief Complaint: Follow-up  History of Present Illness: I had the pleasure of seeing Leon Reyes for a follow up visit at the [facility] on 08/12/2021. He is a 36 y.o. male, who is being followed for urticaria and allergic rhinitis. His previous allergy office visit was on 07/15/2020 with Thermon Leyland, FNP. Today is a regular follow up visit.  History obtained from patient  and  chart review .   Urticaria   He hasn't had flares in 7-9 months.  He stopped his famotidine and ran out of his zyrtec and bought some OTC claritin which he takes one a day.  He will get occasional itch without hives.   Allergic Rhinitis  current therapy: alternates between zyrtec and OTC claritin, sinus rinse symptoms not improved symptoms include:  sinus pressure and nasal congestion Previous allergy testing: yes positive to dust mite History of reflux/heartburn: no Interested in Allergy Immunotherapy: no  He recently updated HVAC system in his house with ionizer which has helped.     Assessment and Plan: [name] is a 36 y.o. male with: Idiopathic urticaria  Other allergic rhinitis Plan: Patient Instructions  Hives (urticaria): well controlled.  Until itch is fully resolved, I would stay on antihistamines.   Cetirizine (Zyrtec) 10mg  one a day You can go up to twice a day as needed to control the itch    Allergic rhinitis Continue avoidance measures directed toward dust mites as listed below Continue cetirizine once a day as needed for a runny nose Start Flonase 1 spray per nostril twice a day for stuffy nose  Consider saline nasal rinses as needed for nasal symptoms. Use this before any medicated nasal sprays for best result  Call the clinic if this treatment plan is not  working well for you  Follow up in 1 year or sooner if needed.    Thank you so much for letting me partake in your care today.  Don't hesitate to reach out if you have any additional concerns!  , MD  [facility] [place] [place]  No follow-ups on file.  Meds ordered this encounter  Medications   cetirizine (ZYRTEC) 10 MG tablet    Sig: Take 1 tablet (10 mg total) by mouth daily.    Dispense:  30 tablet    Refill:  11    Patient must keep yearly follow up office visit on 08/12/21 for future refills.   fluticasone (FLONASE) 50 MCG/ACT nasal spray    Sig: Place 1 spray into both nostrils daily.    Dispense:  16 g    Refill:  11    Lab Orders  No laboratory test(s) ordered today   Diagnostics: None performed   Medication List:  Current Outpatient Medications  Medication Sig Dispense Refill   fluticasone (FLONASE) 50 MCG/ACT nasal spray Place 1 spray into both nostrils daily. 16 g 11   loratadine (CLARITIN) 10 MG tablet Take by mouth.     Multiple Vitamin (MULTIVITAMIN) capsule Take 1 capsule by mouth daily.     traZODone (DESYREL) 100 MG tablet Take 1-2 tablets by mouth at bedtime as needed.     cetirizine (ZYRTEC) 10 MG tablet Take 1 tablet (10 mg total) by mouth daily. 30 tablet 11   famotidine (PEPCID) 20 MG tablet Take  1 tablet (20 mg total) by mouth 2 (two) times daily. (Patient not taking: Reported on 08/12/2021) 60 tablet 0   No current facility-administered medications for this visit.   Allergies: Allergies  Allergen Reactions   Ampicillin Nausea Only and Nausea And Vomiting    Other reaction(s): Abdominal pain, Other (See Comments)   Other Nausea And Vomiting    "Mycins"   I reviewed his past medical history, social history, family history, and environmental history and no significant changes have been reported from his previous visit.  ROS: All others negative except as noted per HPI.   Objective: BP 132/70 (BP Location: Left  Arm, Patient Position: Sitting, Cuff Size: Normal)   Pulse 87   Temp 98.2 F (36.8 C) (Temporal)   Resp 18   Ht 6' (1.829 m)   Wt 194 lb 3.2 oz (88.1 kg)   SpO2 97%   BMI 26.34 kg/m  Body mass index is 26.34 kg/m. General Appearance:  Alert, cooperative, no distress, appears stated age  Head:  Normocephalic, without obvious abnormality, atraumatic  Eyes:  Conjunctiva clear, EOM's intact  Nose: Nares normal,  erythematous nasal mucosa, hypertrophic turbinates, no visible anterior polyps, and septum midline  Throat: Lips, tongue normal; teeth and gums normal, no tonsillar exudate and + cobblestoning  Neck: Supple, symmetrical  Lungs:   clear to auscultation bilaterally, Respirations unlabored, no coughing  Heart:  regular rate and rhythm and no murmur, Appears well perfused  Extremities: No edema  Skin: Skin color, texture, turgor normal, no rashes or lesions on visualized portions of skin  Neurologic: No gross deficits   Previous notes and tests were reviewed. The plan was reviewed with the patient/family, and all questions/concerned were addressed.  It was my pleasure to see [name] today and participate in his care. Please feel free to contact me with any questions or concerns.  Sincerely,  Ferol Luz, MD  Allergy & Immunology  [facility] Office: [phone]
